# Patient Record
Sex: Male | Born: 1985 | Race: Black or African American | Hispanic: No | Marital: Single | Smoking: Former smoker
Health system: Southern US, Community
[De-identification: ages and names within clinical notes are randomized; demographics above are authoritative.]

## PROBLEM LIST (undated history)

## (undated) DIAGNOSIS — M1990 Unspecified osteoarthritis, unspecified site: Secondary | ICD-10-CM

---
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
## 2006-03-02 ENCOUNTER — Emergency Department (HOSPITAL_COMMUNITY): Admission: EM | Admit: 2006-03-02 | Discharge: 2006-03-02 | Payer: Self-pay | Admitting: Emergency Medicine

## 2006-05-10 ENCOUNTER — Emergency Department (HOSPITAL_COMMUNITY): Admission: EM | Admit: 2006-05-10 | Discharge: 2006-05-10 | Payer: Self-pay | Admitting: Emergency Medicine

## 2006-08-11 ENCOUNTER — Emergency Department (HOSPITAL_COMMUNITY): Admission: EM | Admit: 2006-08-11 | Discharge: 2006-08-11 | Payer: Self-pay | Admitting: Family Medicine

## 2006-12-19 ENCOUNTER — Emergency Department (HOSPITAL_COMMUNITY): Admission: EM | Admit: 2006-12-19 | Discharge: 2006-12-19 | Payer: Self-pay | Admitting: Family Medicine

## 2006-12-28 ENCOUNTER — Emergency Department (HOSPITAL_COMMUNITY): Admission: EM | Admit: 2006-12-28 | Discharge: 2006-12-28 | Payer: Self-pay | Admitting: Emergency Medicine

## 2007-06-07 ENCOUNTER — Emergency Department (HOSPITAL_COMMUNITY): Admission: EM | Admit: 2007-06-07 | Discharge: 2007-06-07 | Payer: Self-pay | Admitting: Emergency Medicine

## 2007-08-19 ENCOUNTER — Emergency Department (HOSPITAL_COMMUNITY): Admission: EM | Admit: 2007-08-19 | Discharge: 2007-08-19 | Payer: Self-pay | Admitting: Family Medicine

## 2008-06-24 ENCOUNTER — Emergency Department (HOSPITAL_COMMUNITY): Admission: EM | Admit: 2008-06-24 | Discharge: 2008-06-24 | Payer: Self-pay | Admitting: Family Medicine

## 2008-08-26 ENCOUNTER — Emergency Department (HOSPITAL_COMMUNITY): Admission: EM | Admit: 2008-08-26 | Discharge: 2008-08-26 | Payer: Self-pay | Admitting: Emergency Medicine

## 2009-03-08 ENCOUNTER — Emergency Department (HOSPITAL_COMMUNITY): Admission: EM | Admit: 2009-03-08 | Discharge: 2009-03-08 | Payer: Self-pay | Admitting: Family Medicine

## 2009-04-21 ENCOUNTER — Emergency Department (HOSPITAL_COMMUNITY): Admission: EM | Admit: 2009-04-21 | Discharge: 2009-04-21 | Payer: Self-pay | Admitting: Family Medicine

## 2009-07-29 ENCOUNTER — Emergency Department (HOSPITAL_COMMUNITY): Admission: EM | Admit: 2009-07-29 | Discharge: 2009-07-29 | Payer: Self-pay | Admitting: Emergency Medicine

## 2010-11-03 ENCOUNTER — Emergency Department (HOSPITAL_BASED_OUTPATIENT_CLINIC_OR_DEPARTMENT_OTHER)
Admission: EM | Admit: 2010-11-03 | Discharge: 2010-11-03 | Disposition: A | Discharge status: Home / Self Care | Payer: Medicaid Other | Attending: Emergency Medicine | Admitting: Emergency Medicine

## 2010-11-03 DIAGNOSIS — R221 Localized swelling, mass and lump, neck: Secondary | ICD-10-CM | POA: Insufficient documentation

## 2010-11-03 DIAGNOSIS — R220 Localized swelling, mass and lump, head: Secondary | ICD-10-CM | POA: Insufficient documentation

## 2010-11-03 DIAGNOSIS — M083 Juvenile rheumatoid polyarthritis (seronegative): Secondary | ICD-10-CM | POA: Insufficient documentation

## 2010-11-03 DIAGNOSIS — L089 Local infection of the skin and subcutaneous tissue, unspecified: Secondary | ICD-10-CM | POA: Insufficient documentation

## 2010-11-03 DIAGNOSIS — R599 Enlarged lymph nodes, unspecified: Secondary | ICD-10-CM | POA: Insufficient documentation

## 2011-06-12 LAB — GC/CHLAMYDIA PROBE AMP, GENITAL
Chlamydia, DNA Probe: NEGATIVE
GC Probe Amp, Genital: NEGATIVE

## 2011-06-12 LAB — HIV ANTIBODY (ROUTINE TESTING W REFLEX): HIV: NONREACTIVE

## 2011-06-12 LAB — RPR: RPR Ser Ql: NONREACTIVE

## 2011-06-16 LAB — POCT URINALYSIS DIP (DEVICE)
Glucose, UA: NEGATIVE mg/dL
Protein, ur: NEGATIVE mg/dL
Urobilinogen, UA: 1 mg/dL (ref 0.0–1.0)

## 2011-06-16 LAB — GC/CHLAMYDIA PROBE AMP, GENITAL
Chlamydia, DNA Probe: NEGATIVE
GC Probe Amp, Genital: NEGATIVE

## 2013-10-03 ENCOUNTER — Encounter (HOSPITAL_COMMUNITY): Payer: Self-pay | Admitting: Emergency Medicine

## 2013-10-03 ENCOUNTER — Emergency Department (HOSPITAL_COMMUNITY)
Admission: EM | Admit: 2013-10-03 | Discharge: 2013-10-03 | Disposition: A | Discharge status: Home / Self Care | Payer: Medicaid Other | Attending: Emergency Medicine | Admitting: Emergency Medicine

## 2013-10-03 DIAGNOSIS — Y9289 Other specified places as the place of occurrence of the external cause: Secondary | ICD-10-CM | POA: Insufficient documentation

## 2013-10-03 DIAGNOSIS — F17200 Nicotine dependence, unspecified, uncomplicated: Secondary | ICD-10-CM | POA: Insufficient documentation

## 2013-10-03 DIAGNOSIS — Y9389 Activity, other specified: Secondary | ICD-10-CM | POA: Insufficient documentation

## 2013-10-03 DIAGNOSIS — S199XXA Unspecified injury of neck, initial encounter: Principal | ICD-10-CM

## 2013-10-03 DIAGNOSIS — M62838 Other muscle spasm: Secondary | ICD-10-CM

## 2013-10-03 DIAGNOSIS — Z8739 Personal history of other diseases of the musculoskeletal system and connective tissue: Secondary | ICD-10-CM | POA: Insufficient documentation

## 2013-10-03 DIAGNOSIS — S0993XA Unspecified injury of face, initial encounter: Secondary | ICD-10-CM | POA: Insufficient documentation

## 2013-10-03 DIAGNOSIS — S0990XA Unspecified injury of head, initial encounter: Secondary | ICD-10-CM | POA: Insufficient documentation

## 2013-10-03 HISTORY — DX: Unspecified osteoarthritis, unspecified site: M19.90

## 2013-10-03 MED ORDER — IBUPROFEN 400 MG PO TABS
800.0000 mg | ORAL_TABLET | Freq: Once | ORAL | Status: AC
  Administered 2013-10-03: 800 mg via ORAL
  Filled 2013-10-03: qty 2

## 2013-10-03 MED ORDER — METHOCARBAMOL 500 MG PO TABS: 500.0000 mg | ORAL_TABLET | Freq: Two times a day (BID) | ORAL | Status: DC | PRN

## 2013-10-03 MED ORDER — IBUPROFEN 800 MG PO TABS: 800.0000 mg | ORAL_TABLET | Freq: Three times a day (TID) | ORAL | Status: DC
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
## 2013-10-03 NOTE — Discharge Instructions (Signed)
Take the prescribed medication as directed.  You will continue to be sore for the next few days-- this is normal. May wish to apply heat to affected areas to help relieve soreness. Return to the ED for new or worsening symptoms.

## 2013-10-03 NOTE — ED Provider Notes (Signed)
Medical screening examination/treatment/procedure(s) were performed by non-physician practitioner and as supervising physician I was immediately available for consultation/collaboration.  EKG Interpretation   None         Lexee Brashears, MD 10/03/13 1537 
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
## 2014-12-31 ENCOUNTER — Encounter (HOSPITAL_COMMUNITY): Payer: Self-pay | Admitting: Emergency Medicine

## 2014-12-31 ENCOUNTER — Emergency Department (HOSPITAL_COMMUNITY)
Admission: EM | Admit: 2014-12-31 | Discharge: 2014-12-31 | Disposition: A | Discharge status: Home / Self Care | Payer: Medicaid Other | Source: Home / Self Care | Attending: Family Medicine | Admitting: Family Medicine

## 2014-12-31 ENCOUNTER — Other Ambulatory Visit (HOSPITAL_COMMUNITY)
Admission: RE | Admit: 2014-12-31 | Discharge: 2014-12-31 | Disposition: A | Discharge status: Home / Self Care | Payer: Medicaid Other | Source: Ambulatory Visit | Attending: Family Medicine | Admitting: Family Medicine

## 2014-12-31 DIAGNOSIS — Z202 Contact with and (suspected) exposure to infections with a predominantly sexual mode of transmission: Secondary | ICD-10-CM

## 2014-12-31 DIAGNOSIS — N342 Other urethritis: Secondary | ICD-10-CM | POA: Diagnosis not present

## 2014-12-31 DIAGNOSIS — Z113 Encounter for screening for infections with a predominantly sexual mode of transmission: Secondary | ICD-10-CM | POA: Insufficient documentation

## 2014-12-31 MED ORDER — CEFTRIAXONE SODIUM 250 MG IJ SOLR
250.0000 mg | Freq: Once | INTRAMUSCULAR | Status: AC
  Administered 2014-12-31: 250 mg via INTRAMUSCULAR

## 2014-12-31 MED ORDER — AZITHROMYCIN 250 MG PO TABS
1000.0000 mg | ORAL_TABLET | Freq: Every day | ORAL | Status: DC
  Administered 2014-12-31: 1000 mg via ORAL

## 2014-12-31 MED ORDER — LIDOCAINE HCL (PF) 1 % IJ SOLN
INTRAMUSCULAR | Status: AC
  Filled 2014-12-31: qty 5

## 2014-12-31 MED ORDER — CEFTRIAXONE SODIUM 250 MG IJ SOLR
INTRAMUSCULAR | Status: AC
  Filled 2014-12-31: qty 250

## 2014-12-31 MED ORDER — AZITHROMYCIN 250 MG PO TABS
ORAL_TABLET | ORAL | Status: AC
  Filled 2014-12-31: qty 4

## 2014-12-31 NOTE — Discharge Instructions (Signed)
Urethritis °Urethritis is an inflammation of the tube through which urine exits your bladder (urethra).  °CAUSES °Urethritis is often caused by an infection in your urethra. The infection can be viral, like herpes. The infection can also be bacterial, like gonorrhea. °RISK FACTORS °Risk factors of urethritis include: °· Having sex without using a condom. °· Having multiple sexual partners. °· Having poor hygiene. °SIGNS AND SYMPTOMS °Symptoms of urethritis are less noticeable in women than in men. These symptoms include: °· Burning feeling when you urinate (dysuria). °· Discharge from your urethra. °· Blood in your urine (hematuria). °· Urinating more than usual. °DIAGNOSIS  °To confirm a diagnosis of urethritis, your health care provider will do the following: °· Ask about your sexual history. °· Perform a physical exam. °· Have you provide a sample of your urine for lab testing. °· Use a cotton swab to gently collect a sample from your urethra for lab testing. °TREATMENT  °It is important to treat urethritis. Depending on the cause, untreated urethritis may lead to serious genital infections and possibly infertility. Urethritis caused by a bacterial infection is treated with antibiotic medicine. All sexual partners must be treated.  °HOME CARE INSTRUCTIONS °· Do not have sex until the test results are known and treatment is completed, even if your symptoms go away before you finish treatment. °· If you were prescribed an antibiotic, finish it all even if you start to feel better. °SEEK MEDICAL CARE IF:  °· Your symptoms are not improved in 3 days. °· Your symptoms are getting worse. °· You develop abdominal pain or pelvic pain (in women). °· You develop joint pain. °· You have a fever. °SEEK IMMEDIATE MEDICAL CARE IF:  °· You have severe pain in the belly, back, or side. °· You have repeated vomiting. °MAKE SURE YOU: °· Understand these instructions. °· Will watch your condition. °· Will get help right away if you  are not doing well or get worse. °Document Released: 02/21/2001 Document Revised: 01/12/2014 Document Reviewed: 04/28/2013 °Exit[employer]® Patient Information ©2015 [employer]. This information is not intended to replace advice given to you by your health care provider. Make sure you discuss any questions you have with your health care provider. ° °

## 2014-12-31 NOTE — ED Provider Notes (Signed)
CSN: [id number]     Arrival date & time 12/31/14  1734 History   First MD Initiated Contact with Patient 12/31/14 1830     Chief Complaint  Patient presents with  . Exposure to STD   (Consider location/radiation/quality/duration/timing/severity/associated sxs/prior Treatment) HPI    29 year old male presents complaining of a possible STD. He has penile discharge that started this morning. He also has a known exposure to STDs. No dysuria or testicle pain.    Past Medical History  Diagnosis Date  . Arthritis    No past surgical history on file. No family history on file. History  Substance Use Topics  . Smoking status: Current Every Day Smoker    Types: Cigarettes  . Smokeless tobacco: Not on file  . Alcohol Use: Yes    Review of Systems  Genitourinary: Positive for discharge. Negative for dysuria and testicular pain.  All other systems reviewed and are negative.   Allergies  Review of patient's allergies indicates no known allergies.  Home Medications   Prior to Admission medications   Medication Sig Start Date End Date Taking? Authorizing Provider  ibuprofen (ADVIL,MOTRIN) 800 MG tablet Take 1 tablet (800 mg total) by mouth 3 (three) times daily. 10/03/13   Garlon HatchetLisa M Sanders, PA-C  methocarbamol (ROBAXIN) 500 MG tablet Take 1 tablet (500 mg total) by mouth 2 (two) times daily as needed. 10/03/13   Garlon HatchetLisa M Sanders, PA-C   BP 136/96 mmHg  Pulse 82  Temp(Src) 98.2 F (36.8 C) (Oral)  SpO2 100% Physical Exam  Constitutional: He is oriented to person, place, and time. He appears well-developed and well-nourished. No distress.  HENT:  Head: Normocephalic.  Pulmonary/Chest: Effort normal. No respiratory distress.  Genitourinary: Testes normal. No penile tenderness. Discharge found.  Lymphadenopathy:       Right: No inguinal adenopathy present.       Left: Inguinal adenopathy present.  Neurological: He is alert and oriented to person, place, and time. Coordination normal.   Skin: Skin is warm and dry. No rash noted. He is not diaphoretic.  Psychiatric: He has a normal mood and affect. Judgment normal.  Nursing note and vitals reviewed.   ED Course  Procedures (including critical care time) Labs Review Labs Reviewed  RPR  HIV ANTIBODY (ROUTINE TESTING)  URINE CYTOLOGY ANCILLARY ONLY    Imaging Review No results found.   MDM   1. Urethritis    Treated with 250 mg ceftriaxone IM and 1 g of azithromycin by mouth. Urine cytology sent as well as HIV and RPR. Follow up PRN   Meds ordered this encounter  Medications  . cefTRIAXone (ROCEPHIN) injection 250 mg    Sig:   . azithromycin (ZITHROMAX) tablet 1,000 mg    Sig:       Graylon GoodZachary H Scot Shiraishi, PA-C 12/31/14 1851

## 2014-12-31 NOTE — ED Notes (Signed)
Pt states that he was told by partner he was exposed to a STD

## 2015-01-01 LAB — URINE CYTOLOGY ANCILLARY ONLY
Chlamydia: POSITIVE — AB
NEISSERIA GONORRHEA: POSITIVE — AB
TRICH (WINDOWPATH): NEGATIVE

## 2015-01-01 LAB — HIV ANTIBODY (ROUTINE TESTING W REFLEX): HIV Screen 4th Generation wRfx: NONREACTIVE

## 2015-01-01 LAB — RPR: RPR: NONREACTIVE

## 2015-01-01 NOTE — ED Notes (Signed)
Lab reports negative 

## 2015-01-04 ENCOUNTER — Telehealth (HOSPITAL_COMMUNITY): Payer: Self-pay | Admitting: *Deleted

## 2015-01-04 NOTE — ED Notes (Addendum)
GC and Chlamydia pos., Trich neg., HIV/RPR non-reactive.  I called pt. Pt. verified x 2 and given results.  Pt. told he was adequately treated with Rocephin and Zithromax.  Pt. instructed to notify his partner, no sex for 1 week and to practice safe sex. Pt. told [facility] by appointment. Patrick Warren, Patrick Warren 01/04/2015 DHHS forms x 2 completed [facility]  Patrick Warren, Patrick Warren  01/06/2015

## 2015-03-22 ENCOUNTER — Emergency Department (HOSPITAL_COMMUNITY)
Admission: EM | Admit: 2015-03-22 | Discharge: 2015-03-22 | Disposition: A | Discharge status: Home / Self Care | Payer: Medicaid Other | Source: Home / Self Care | Attending: Family Medicine | Admitting: Family Medicine

## 2015-03-22 ENCOUNTER — Other Ambulatory Visit (HOSPITAL_COMMUNITY)
Admission: RE | Admit: 2015-03-22 | Discharge: 2015-03-22 | Disposition: A | Discharge status: Home / Self Care | Payer: Medicaid Other | Source: Ambulatory Visit | Attending: Family Medicine | Admitting: Family Medicine

## 2015-03-22 ENCOUNTER — Encounter (HOSPITAL_COMMUNITY): Payer: Self-pay | Admitting: Emergency Medicine

## 2015-03-22 DIAGNOSIS — A5401 Gonococcal cystitis and urethritis, unspecified: Secondary | ICD-10-CM

## 2015-03-22 DIAGNOSIS — A5400 Gonococcal infection of lower genitourinary tract, unspecified: Secondary | ICD-10-CM | POA: Diagnosis not present

## 2015-03-22 DIAGNOSIS — Z113 Encounter for screening for infections with a predominantly sexual mode of transmission: Secondary | ICD-10-CM | POA: Insufficient documentation

## 2015-03-22 MED ORDER — CEFTRIAXONE SODIUM 250 MG IJ SOLR
INTRAMUSCULAR | Status: AC
  Filled 2015-03-22: qty 250

## 2015-03-22 MED ORDER — AZITHROMYCIN 250 MG PO TABS
1000.0000 mg | ORAL_TABLET | Freq: Once | ORAL | Status: AC
  Administered 2015-03-22: 1000 mg via ORAL

## 2015-03-22 MED ORDER — LIDOCAINE HCL (PF) 1 % IJ SOLN
INTRAMUSCULAR | Status: AC
  Filled 2015-03-22: qty 5

## 2015-03-22 MED ORDER — AZITHROMYCIN 250 MG PO TABS
ORAL_TABLET | ORAL | Status: AC
  Filled 2015-03-22: qty 4

## 2015-03-22 MED ORDER — CEFTRIAXONE SODIUM 250 MG IJ SOLR
250.0000 mg | Freq: Once | INTRAMUSCULAR | Status: AC
  Administered 2015-03-22: 250 mg via INTRAMUSCULAR

## 2015-03-22 NOTE — Discharge Instructions (Signed)
We will call with positive test results and treat as indicated  °

## 2015-03-22 NOTE — ED Notes (Signed)
Pt states he is having d/c, but denies any pain or other issues.

## 2015-03-22 NOTE — ED Provider Notes (Signed)
CSN: [id number]     Arrival date & time 03/22/15  1607 History   First MD Initiated Contact with Patient 03/22/15 1737     Chief Complaint  Patient presents with  . SEXUALLY TRANSMITTED DISEASE   (Consider location/radiation/quality/duration/timing/severity/associated sxs/prior Treatment) Patient is a 29 y.o. male presenting with STD exposure. The history is provided by the patient.  Exposure to STD This is a new problem. The current episode started yesterday (green urethral d/c). The problem has been gradually worsening.    Past Medical History  Diagnosis Date  . Arthritis    History reviewed. No pertinent past surgical history. History reviewed. No pertinent family history. History  Substance Use Topics  . Smoking status: Current Every Day Smoker    Types: Cigarettes  . Smokeless tobacco: Not on file  . Alcohol Use: Yes    Review of Systems  Gastrointestinal: Negative.   Genitourinary: Positive for discharge. Negative for dysuria, urgency, decreased urine volume and genital sores.  Musculoskeletal: Negative.     Allergies  Review of patient's allergies indicates no known allergies.  Home Medications   Prior to Admission medications   Medication Sig Start Date End Date Taking? Authorizing Provider  ibuprofen (ADVIL,MOTRIN) 800 MG tablet Take 1 tablet (800 mg total) by mouth 3 (three) times daily. 10/03/13   Garlon HatchetLisa M Sanders, PA-C  methocarbamol (ROBAXIN) 500 MG tablet Take 1 tablet (500 mg total) by mouth 2 (two) times daily as needed. 10/03/13   Garlon HatchetLisa M Sanders, PA-C   BP 124/82 mmHg  Pulse 68  Temp(Src) 97.5 F (36.4 C) (Oral)  Resp 18  SpO2 100% Physical Exam  Constitutional: He is oriented to person, place, and time. He appears well-developed and well-nourished. No distress.  Genitourinary: No penile tenderness.  Purulent urethral d/c.  Neurological: He is alert and oriented to person, place, and time.  Skin: Skin is warm and dry.  Nursing note and vitals  reviewed.   ED Course  Procedures (including critical care time) Labs Review Labs Reviewed  CYTOLOGY, (ORAL, ANAL, URETHRAL) ANCILLARY ONLY    Imaging Review No results found.   MDM   1. Urethritis, gonococcal, acute        Linna HoffJames D Myrta Mercer, MD 03/22/15 2021

## 2015-03-23 LAB — CYTOLOGY, (ORAL, ANAL, URETHRAL) ANCILLARY ONLY
CHLAMYDIA, DNA PROBE: NEGATIVE
Neisseria Gonorrhea: POSITIVE — AB
Trichomonas: NEGATIVE

## 2015-03-23 NOTE — ED Notes (Signed)
Final report of STD screening positive for GC. Treatment adequate w medications given to patient day of [facility] visit. [id number]. Attempted to reach patient at number provided on day of visit, but male who answered stated there was no one by that name at that number. Letter sent to address provided on day of visit for him to contact us for his results

## 2015-03-26 NOTE — ED Notes (Signed)
Call from patient to inquire about his test results. After verifying ID, discussed lab results. Patient has been advised he has received adequate treatment, and he is to practice safer sex, and he is to inform his partner , so they can also be treated . He is also to avoid sex x 7 more days to assure proper remission of infection

## 2015-12-03 ENCOUNTER — Emergency Department (HOSPITAL_COMMUNITY)
Admission: EM | Admit: 2015-12-03 | Discharge: 2015-12-03 | Disposition: A | Discharge status: Home / Self Care | Payer: Medicaid Other | Attending: Physician Assistant | Admitting: Physician Assistant

## 2015-12-03 ENCOUNTER — Encounter (HOSPITAL_COMMUNITY): Payer: Self-pay | Admitting: Emergency Medicine

## 2015-12-03 DIAGNOSIS — S51811A Laceration without foreign body of right forearm, initial encounter: Secondary | ICD-10-CM | POA: Insufficient documentation

## 2015-12-03 DIAGNOSIS — F17210 Nicotine dependence, cigarettes, uncomplicated: Secondary | ICD-10-CM | POA: Insufficient documentation

## 2015-12-03 DIAGNOSIS — M1990 Unspecified osteoarthritis, unspecified site: Secondary | ICD-10-CM | POA: Insufficient documentation

## 2015-12-03 DIAGNOSIS — Z791 Long term (current) use of non-steroidal anti-inflammatories (NSAID): Secondary | ICD-10-CM | POA: Diagnosis not present

## 2015-12-03 DIAGNOSIS — S46221A Laceration of muscle, fascia and tendon of other parts of biceps, right arm, initial encounter: Secondary | ICD-10-CM | POA: Insufficient documentation

## 2015-12-03 DIAGNOSIS — Y998 Other external cause status: Secondary | ICD-10-CM | POA: Insufficient documentation

## 2015-12-03 DIAGNOSIS — Y9289 Other specified places as the place of occurrence of the external cause: Secondary | ICD-10-CM | POA: Diagnosis not present

## 2015-12-03 DIAGNOSIS — Y9389 Activity, other specified: Secondary | ICD-10-CM | POA: Insufficient documentation

## 2015-12-03 DIAGNOSIS — S61011A Laceration without foreign body of right thumb without damage to nail, initial encounter: Secondary | ICD-10-CM | POA: Diagnosis not present

## 2015-12-03 DIAGNOSIS — Z23 Encounter for immunization: Secondary | ICD-10-CM | POA: Insufficient documentation

## 2015-12-03 DIAGNOSIS — IMO0002 Reserved for concepts with insufficient information to code with codable children: Secondary | ICD-10-CM

## 2015-12-03 DIAGNOSIS — W25XXXA Contact with sharp glass, initial encounter: Secondary | ICD-10-CM | POA: Insufficient documentation

## 2015-12-03 MED ORDER — CEPHALEXIN 500 MG PO CAPS: 500.0000 mg | ORAL_CAPSULE | Freq: Three times a day (TID) | ORAL | Status: DC

## 2015-12-03 MED ORDER — LIDOCAINE-EPINEPHRINE (PF) 2 %-1:200000 IJ SOLN
40.0000 mL | Freq: Once | INTRAMUSCULAR | Status: AC
  Administered 2015-12-03: 40 mL
  Filled 2015-12-03: qty 40

## 2015-12-03 MED ORDER — TETANUS-DIPHTH-ACELL PERTUSSIS 5-2.5-18.5 LF-MCG/0.5 IM SUSP
0.5000 mL | Freq: Once | INTRAMUSCULAR | Status: AC
  Administered 2015-12-03: 0.5 mL via INTRAMUSCULAR
  Filled 2015-12-03: qty 0.5

## 2015-12-03 MED ORDER — OXYCODONE-ACETAMINOPHEN 5-325 MG PO TABS
1.0000 | ORAL_TABLET | Freq: Once | ORAL | Status: AC
  Administered 2015-12-03: 1 via ORAL
  Filled 2015-12-03: qty 1
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
## 2015-12-03 NOTE — Discharge Instructions (Signed)
Keep her wound clean and dry. Take all of your antibiotics as prescribed. Do not save or share them. Return to ED in 12 days for removal of your sutures. Return sooner fevers, chills, increased redness or swelling, cloudy drainage or other symptoms as we discussed.

## 2015-12-03 NOTE — ED Notes (Signed)
Ben PA at bedside. 
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
## 2015-12-15 ENCOUNTER — Ambulatory Visit (HOSPITAL_COMMUNITY)
Admission: EM | Admit: 2015-12-15 | Discharge: 2015-12-15 | Disposition: A | Discharge status: Home / Self Care | Payer: Medicaid Other | Attending: Family Medicine | Admitting: Family Medicine

## 2015-12-15 ENCOUNTER — Encounter (HOSPITAL_COMMUNITY): Payer: Self-pay | Admitting: Emergency Medicine

## 2015-12-15 DIAGNOSIS — Z4802 Encounter for removal of sutures: Secondary | ICD-10-CM

## 2015-12-15 NOTE — Discharge Instructions (Signed)
Suture Removal, Care After Lungs clear and clean with warm soap and water. For any signs of infection return to the urgent care. Start taking your antibiotics promptly. Do not get the wound dirty. Refer to this sheet in the next few weeks. These instructions provide you with information on caring for yourself after your procedure. Your health care provider may also give you more specific instructions. Your treatment has been planned according to current medical practices, but problems sometimes occur. Call your health care provider if you have any problems or questions after your procedure. WHAT TO EXPECT AFTER THE PROCEDURE After your stitches (sutures) are removed, it is typical to have the following:  Some discomfort and swelling in the wound area.  Slight redness in the area. HOME CARE INSTRUCTIONS   If you have skin adhesive strips over the wound area, do not take the strips off. They will fall off on their own in a few days. If the strips remain in place after 14 days, you may remove them.  Change any bandages (dressings) at least once a day or as directed by your health care provider. If the bandage sticks, soak it off with warm, soapy water.  Apply cream or ointment only as directed by your health care provider. If using cream or ointment, wash the area with soap and water 2 times a day to remove all the cream or ointment. Rinse off the soap and pat the area dry with a clean towel.  Keep the wound area dry and clean. If the bandage becomes wet or dirty, or if it develops a bad smell, change it as soon as possible.  Continue to protect the wound from injury.  Use sunscreen when out in the sun. New scars become sunburned easily. SEEK MEDICAL CARE IF:  You have increasing redness, swelling, or pain in the wound.  You see pus coming from the wound.  You have a fever.  You notice a bad smell coming from the wound or dressing.  Your wound breaks open (edges not staying together).   This information is not intended to replace advice given to you by your health care provider. Make sure you discuss any questions you have with your health care provider.   Document Released: 05/23/2001 Document Revised: 06/18/2013 Document Reviewed: 04/09/2013 Elsevier Interactive Patient Education [employer]2016 Elsevier Inc.
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
## 2015-12-15 NOTE — ED Notes (Signed)
Multiple sutured sites.  Patient reports sutures have been in for 12 days.   Also has an avulsion injury to left thumb that occurred this evening.  Reports injury caused by a piece of glass.

## 2016-01-03 ENCOUNTER — Emergency Department (HOSPITAL_COMMUNITY)
Admission: EM | Admit: 2016-01-03 | Discharge: 2016-01-03 | Disposition: A | Discharge status: Home / Self Care | Payer: Medicaid Other | Attending: Emergency Medicine | Admitting: Emergency Medicine

## 2016-01-03 ENCOUNTER — Encounter (HOSPITAL_COMMUNITY): Payer: Self-pay | Admitting: *Deleted

## 2016-01-03 ENCOUNTER — Emergency Department (HOSPITAL_COMMUNITY): Payer: Medicaid Other

## 2016-01-03 DIAGNOSIS — S3992XA Unspecified injury of lower back, initial encounter: Secondary | ICD-10-CM | POA: Insufficient documentation

## 2016-01-03 DIAGNOSIS — Y9389 Activity, other specified: Secondary | ICD-10-CM | POA: Insufficient documentation

## 2016-01-03 DIAGNOSIS — Z791 Long term (current) use of non-steroidal anti-inflammatories (NSAID): Secondary | ICD-10-CM | POA: Diagnosis not present

## 2016-01-03 DIAGNOSIS — Y998 Other external cause status: Secondary | ICD-10-CM | POA: Diagnosis not present

## 2016-01-03 DIAGNOSIS — M1990 Unspecified osteoarthritis, unspecified site: Secondary | ICD-10-CM | POA: Insufficient documentation

## 2016-01-03 DIAGNOSIS — F17210 Nicotine dependence, cigarettes, uncomplicated: Secondary | ICD-10-CM | POA: Insufficient documentation

## 2016-01-03 DIAGNOSIS — Y92410 Unspecified street and highway as the place of occurrence of the external cause: Secondary | ICD-10-CM | POA: Diagnosis not present

## 2016-01-03 DIAGNOSIS — Z792 Long term (current) use of antibiotics: Secondary | ICD-10-CM | POA: Diagnosis not present

## 2016-01-03 DIAGNOSIS — M545 Low back pain, unspecified: Secondary | ICD-10-CM

## 2016-01-03 DIAGNOSIS — S29002A Unspecified injury of muscle and tendon of back wall of thorax, initial encounter: Secondary | ICD-10-CM | POA: Insufficient documentation

## 2016-01-03 MED ORDER — KETOROLAC TROMETHAMINE 30 MG/ML IJ SOLN
30.0000 mg | Freq: Once | INTRAMUSCULAR | Status: AC
  Administered 2016-01-03: 30 mg via INTRAMUSCULAR
  Filled 2016-01-03: qty 1

## 2016-01-03 MED ORDER — CYCLOBENZAPRINE HCL 10 MG PO TABS: 10.0000 mg | ORAL_TABLET | Freq: Two times a day (BID) | ORAL | Status: DC | PRN

## 2016-01-03 MED ORDER — NAPROXEN 500 MG PO TABS: 500.0000 mg | ORAL_TABLET | Freq: Two times a day (BID) | ORAL | Status: DC
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
## 2016-01-31 ENCOUNTER — Encounter (HOSPITAL_COMMUNITY): Payer: Self-pay | Admitting: Emergency Medicine

## 2016-01-31 ENCOUNTER — Emergency Department (HOSPITAL_COMMUNITY)
Admission: EM | Admit: 2016-01-31 | Discharge: 2016-01-31 | Disposition: A | Discharge status: Home / Self Care | Payer: Medicaid Other | Attending: Dermatology | Admitting: Dermatology

## 2016-01-31 DIAGNOSIS — R51 Headache: Secondary | ICD-10-CM | POA: Diagnosis present

## 2016-01-31 DIAGNOSIS — F17210 Nicotine dependence, cigarettes, uncomplicated: Secondary | ICD-10-CM | POA: Insufficient documentation

## 2016-01-31 DIAGNOSIS — Y999 Unspecified external cause status: Secondary | ICD-10-CM | POA: Insufficient documentation

## 2016-01-31 DIAGNOSIS — M1990 Unspecified osteoarthritis, unspecified site: Secondary | ICD-10-CM | POA: Insufficient documentation

## 2016-01-31 DIAGNOSIS — Y92410 Unspecified street and highway as the place of occurrence of the external cause: Secondary | ICD-10-CM | POA: Insufficient documentation

## 2016-01-31 DIAGNOSIS — Z5321 Procedure and treatment not carried out due to patient leaving prior to being seen by health care provider: Secondary | ICD-10-CM | POA: Insufficient documentation

## 2016-01-31 DIAGNOSIS — Y939 Activity, unspecified: Secondary | ICD-10-CM | POA: Diagnosis not present

## 2016-01-31 NOTE — ED Notes (Signed)
Attempted to call patient's name in lobby to obtain vitals. No response
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
## 2018-01-26 ENCOUNTER — Other Ambulatory Visit: Payer: Self-pay

## 2018-01-26 ENCOUNTER — Encounter (HOSPITAL_COMMUNITY): Payer: Self-pay | Admitting: Emergency Medicine

## 2018-01-26 ENCOUNTER — Ambulatory Visit (HOSPITAL_COMMUNITY)
Admission: EM | Admit: 2018-01-26 | Discharge: 2018-01-26 | Disposition: A | Discharge status: Home / Self Care | Payer: Medicaid Other | Attending: Internal Medicine | Admitting: Internal Medicine

## 2018-01-26 DIAGNOSIS — Z113 Encounter for screening for infections with a predominantly sexual mode of transmission: Secondary | ICD-10-CM | POA: Diagnosis not present

## 2018-01-26 DIAGNOSIS — Z202 Contact with and (suspected) exposure to infections with a predominantly sexual mode of transmission: Secondary | ICD-10-CM | POA: Diagnosis not present

## 2018-01-26 DIAGNOSIS — F17210 Nicotine dependence, cigarettes, uncomplicated: Secondary | ICD-10-CM | POA: Diagnosis not present

## 2018-01-26 DIAGNOSIS — Z79899 Other long term (current) drug therapy: Secondary | ICD-10-CM | POA: Insufficient documentation

## 2018-01-26 MED ORDER — METRONIDAZOLE 500 MG PO TABS: 2000.0000 mg | ORAL_TABLET | Freq: Once | ORAL | 0 refills | Status: AC
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
## 2018-01-26 NOTE — Discharge Instructions (Signed)
Please take all 4 pills of metronidazole tomorrow when you have food on your stomach.  Do not drink alcohol while taking  We will call with results and send in any further needed medicaiton.

## 2018-01-28 ENCOUNTER — Telehealth (HOSPITAL_COMMUNITY): Payer: Self-pay

## 2018-01-28 LAB — URINE CYTOLOGY ANCILLARY ONLY
Chlamydia: NEGATIVE
NEISSERIA GONORRHEA: NEGATIVE
TRICH (WINDOWPATH): NEGATIVE

## 2018-01-28 NOTE — Telephone Encounter (Signed)
Std screening negative, pt called and made aware. Answered all questions.

## 2018-02-08 ENCOUNTER — Emergency Department (HOSPITAL_COMMUNITY)
Admission: EM | Admit: 2018-02-08 | Discharge: 2018-02-08 | Disposition: A | Discharge status: Home / Self Care | Payer: Medicaid Other | Attending: Emergency Medicine | Admitting: Emergency Medicine

## 2018-02-08 ENCOUNTER — Encounter (HOSPITAL_COMMUNITY): Payer: Self-pay

## 2018-02-08 DIAGNOSIS — F17210 Nicotine dependence, cigarettes, uncomplicated: Secondary | ICD-10-CM | POA: Insufficient documentation

## 2018-02-08 DIAGNOSIS — F1410 Cocaine abuse, uncomplicated: Secondary | ICD-10-CM | POA: Diagnosis not present

## 2018-02-08 DIAGNOSIS — R55 Syncope and collapse: Secondary | ICD-10-CM | POA: Diagnosis not present

## 2018-02-08 LAB — ETHANOL: ALCOHOL ETHYL (B): 31 mg/dL — AB (ref <10)

## 2018-02-08 LAB — RAPID URINE DRUG SCREEN, HOSP PERFORMED
AMPHETAMINES: NOT DETECTED
BENZODIAZEPINES: NOT DETECTED
Barbiturates: NOT DETECTED
Cocaine: POSITIVE — AB
Opiates: NOT DETECTED
Tetrahydrocannabinol: NOT DETECTED

## 2018-02-08 MED ORDER — AMMONIA AROMATIC IN INHA
RESPIRATORY_TRACT | Status: AC
  Filled 2018-02-08: qty 10

## 2018-02-08 MED ORDER — SODIUM CHLORIDE 0.9 % IV BOLUS
1000.0000 mL | Freq: Once | INTRAVENOUS | Status: AC
  Administered 2018-02-08: 1000 mL via INTRAVENOUS

## 2018-02-08 NOTE — ED Provider Notes (Signed)
Care assumed from Dr. Read Drivers at 8:10 AM.  Patient somnolent.  However, after sleeping I reevaluated him.  He awakens to an ammonia capsule.  He is awake.  Amatory.  And texting on his phone.  He is appropriate for discharge.  He is stable vital signs without tachycardia, hypotension, fever, or hypoxemia.   Rolland Porter, MD 02/08/18 [phone]

## 2018-02-08 NOTE — Discharge Instructions (Addendum)
Just say no to drugs!!!!!!! °
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
## 2018-02-08 NOTE — ED Triage Notes (Signed)
Pt is in police custody for possession of drugs and when they got to the police station is was starting to pass out Pt was talking to EMS saying he was hot and thirsty Pt's dad's funeral is today

## 2018-06-05 ENCOUNTER — Encounter (HOSPITAL_COMMUNITY): Payer: Self-pay | Admitting: Emergency Medicine

## 2018-06-05 ENCOUNTER — Emergency Department (HOSPITAL_COMMUNITY)
Admission: EM | Admit: 2018-06-05 | Discharge: 2018-06-05 | Disposition: A | Discharge status: Home / Self Care | Payer: Medicaid Other | Attending: Emergency Medicine | Admitting: Emergency Medicine

## 2018-06-05 ENCOUNTER — Emergency Department (HOSPITAL_COMMUNITY): Payer: Medicaid Other

## 2018-06-05 ENCOUNTER — Other Ambulatory Visit: Payer: Self-pay

## 2018-06-05 DIAGNOSIS — Y929 Unspecified place or not applicable: Secondary | ICD-10-CM | POA: Diagnosis not present

## 2018-06-05 DIAGNOSIS — S0181XA Laceration without foreign body of other part of head, initial encounter: Secondary | ICD-10-CM | POA: Insufficient documentation

## 2018-06-05 DIAGNOSIS — Y999 Unspecified external cause status: Secondary | ICD-10-CM | POA: Insufficient documentation

## 2018-06-05 DIAGNOSIS — Y939 Activity, unspecified: Secondary | ICD-10-CM | POA: Insufficient documentation

## 2018-06-05 DIAGNOSIS — S098XXA Other specified injuries of head, initial encounter: Secondary | ICD-10-CM | POA: Diagnosis present

## 2018-06-05 DIAGNOSIS — Z79899 Other long term (current) drug therapy: Secondary | ICD-10-CM | POA: Diagnosis not present

## 2018-06-05 DIAGNOSIS — F17210 Nicotine dependence, cigarettes, uncomplicated: Secondary | ICD-10-CM | POA: Insufficient documentation

## 2018-06-05 MED ORDER — CEPHALEXIN 500 MG PO CAPS: 500.0000 mg | ORAL_CAPSULE | Freq: Three times a day (TID) | ORAL | 0 refills | Status: AC

## 2018-06-05 MED ORDER — LIDOCAINE-EPINEPHRINE (PF) 2 %-1:200000 IJ SOLN
10.0000 mL | Freq: Once | INTRAMUSCULAR | Status: AC
  Administered 2018-06-05: 10 mL
  Filled 2018-06-05: qty 20
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
## 2018-06-05 NOTE — ED Notes (Signed)
ED Provider at bedside. 

## 2018-06-05 NOTE — Discharge Instructions (Addendum)
Wound check in 2 days with urgent care or PCP referral above. Suture removal in 5 days with urgent care or PCP. Return to ER for any concerning symptoms.
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
## 2018-06-10 ENCOUNTER — Ambulatory Visit (HOSPITAL_COMMUNITY)
Admission: EM | Admit: 2018-06-10 | Discharge: 2018-06-10 | Disposition: A | Discharge status: Home / Self Care | Payer: Medicaid Other

## 2018-06-10 DIAGNOSIS — Z4802 Encounter for removal of sutures: Secondary | ICD-10-CM | POA: Diagnosis not present

## 2018-06-10 DIAGNOSIS — S0181XA Laceration without foreign body of other part of head, initial encounter: Secondary | ICD-10-CM

## 2018-06-10 NOTE — ED Notes (Signed)
Bed: UC01 Expected date:  Expected time:  Means of arrival:  Comments: 

## 2018-06-10 NOTE — ED Notes (Signed)
Pt here for suture removal. 7 sutures removed from forehead. Wound well healed. Mild bleeding. Bandage applied. Pt had no further needs.

## 2018-12-25 ENCOUNTER — Encounter (HOSPITAL_COMMUNITY): Payer: Self-pay | Admitting: *Deleted

## 2018-12-25 ENCOUNTER — Other Ambulatory Visit: Payer: Self-pay

## 2018-12-25 ENCOUNTER — Emergency Department (HOSPITAL_COMMUNITY)
Admission: EM | Admit: 2018-12-25 | Discharge: 2018-12-25 | Disposition: A | Discharge status: Home / Self Care | Payer: Medicaid Other | Attending: Emergency Medicine | Admitting: Emergency Medicine

## 2018-12-25 DIAGNOSIS — F17210 Nicotine dependence, cigarettes, uncomplicated: Secondary | ICD-10-CM | POA: Insufficient documentation

## 2018-12-25 DIAGNOSIS — Z79899 Other long term (current) drug therapy: Secondary | ICD-10-CM | POA: Insufficient documentation

## 2018-12-25 DIAGNOSIS — J020 Streptococcal pharyngitis: Secondary | ICD-10-CM | POA: Diagnosis not present

## 2018-12-25 DIAGNOSIS — J029 Acute pharyngitis, unspecified: Secondary | ICD-10-CM | POA: Diagnosis present

## 2018-12-25 LAB — GROUP A STREP BY PCR: Group A Strep by PCR: DETECTED — AB

## 2018-12-25 MED ORDER — IBUPROFEN 200 MG PO TABS
600.0000 mg | ORAL_TABLET | Freq: Once | ORAL | Status: AC
  Administered 2018-12-25: 600 mg via ORAL
  Filled 2018-12-25: qty 3

## 2018-12-25 MED ORDER — PENICILLIN G BENZATHINE 1200000 UNIT/2ML IM SUSP
1.2000 10*6.[IU] | Freq: Once | INTRAMUSCULAR | Status: AC
  Administered 2018-12-25: 1.2 10*6.[IU] via INTRAMUSCULAR
  Filled 2018-12-25: qty 2

## 2018-12-25 MED ORDER — DEXAMETHASONE 4 MG PO TABS
8.0000 mg | ORAL_TABLET | Freq: Once | ORAL | Status: AC
  Administered 2018-12-25: 03:00:00 8 mg via ORAL
  Filled 2018-12-25: qty 2

## 2018-12-25 NOTE — ED Provider Notes (Signed)
[facility]-EMERGENCY DEPT Provider Note   CSN: [id number] Arrival date & time: 12/25/18  0155    History   Chief Complaint Chief Complaint  Patient presents with  . Sore Throat    HPI Patrick Warren is a 33 y.o. male presenting to the emergency department with gradual onset of sore throat x3 days.  Patient states his throat began feeling sore when he was playing cards.  He states he was yelling and thought this may have been the cause of his sore throat.  No aggravating factors.  He developed a mild dry cough.  Has been treating his symptoms with Alka-Seltzer cold and flu.  Denies rhinorrhea, nasal congestion, ear pain, fever.  No known sick contacts.     The history is provided by the patient.    Past Medical History:  Diagnosis Date  . Arthritis     There are no active problems to display for this patient.   History reviewed. No pertinent surgical history.      Home Medications    Prior to Admission medications   Medication Sig Start Date End Date Taking? Authorizing Provider  cyclobenzaprine (FLEXERIL) 10 MG tablet Take 1 tablet (10 mg total) by mouth 2 (two) times daily as needed for muscle spasms. 01/03/16   Cartner, Sharlet SalinaBenjamin, PA-C  ibuprofen (ADVIL,MOTRIN) 800 MG tablet Take 1 tablet (800 mg total) by mouth 3 (three) times daily. 10/03/13   Garlon HatchetSanders, Lisa M, PA-C  methocarbamol (ROBAXIN) 500 MG tablet Take 1 tablet (500 mg total) by mouth 2 (two) times daily as needed. 10/03/13   Garlon HatchetSanders, Lisa M, PA-C  naproxen (NAPROSYN) 500 MG tablet Take 1 tablet (500 mg total) by mouth 2 (two) times daily. 01/03/16   Joycie Peekartner, Benjamin, PA-C    Family History Family History  Problem Relation Age of Onset  . Cancer Father     Social History Social History   Tobacco Use  . Smoking status: Current Every Day Smoker    Types: Cigarettes  . Smokeless tobacco: Never Used  Substance Use Topics  . Alcohol use: Yes  . Drug use: Yes    Types: Marijuana      Allergies   Patient has no known allergies.   Review of Systems Review of Systems  Constitutional: Negative for fever.  HENT: Positive for sore throat. Negative for congestion, ear pain, rhinorrhea and trouble swallowing.   Respiratory: Positive for cough. Negative for shortness of breath.      Physical Exam Updated Vital Signs BP (!) 137/96 (BP Location: Left Arm)   Pulse 71   Temp 98.6 F (37 C) (Oral)   Resp 16   Ht 5\' 4"  (1.626 m)   Wt 72.6 kg   SpO2 99%   BMI 27.46 kg/m   Physical Exam Vitals signs and nursing note reviewed.  Constitutional:      General: He is not in acute distress.    Appearance: He is well-developed.  HENT:     Head: Normocephalic and atraumatic.     Right Ear: There is impacted cerumen.     Left Ear:  No middle ear effusion. Tympanic membrane is erythematous.     Mouth/Throat:     Pharynx: Uvula midline. Posterior oropharyngeal erythema present. No pharyngeal swelling, oropharyngeal exudate or uvula swelling.     Tonsils: No tonsillar exudate or tonsillar abscesses.     Comments: Tolerating secretions.  No trismus. Eyes:     Conjunctiva/sclera: Conjunctivae normal.  Neck:  Musculoskeletal: Normal range of motion and neck supple. No neck rigidity.  Cardiovascular:     Rate and Rhythm: Normal rate and regular rhythm.  Pulmonary:     Effort: Pulmonary effort is normal. No respiratory distress.     Breath sounds: Normal breath sounds.  Lymphadenopathy:     Cervical: Cervical adenopathy (Mild bilateral anterior cervical) present.  Neurological:     Mental Status: He is alert.  Psychiatric:        Mood and Affect: Mood normal.        Behavior: Behavior normal.      ED Treatments / Results  Labs (all labs ordered are listed, but only abnormal results are displayed) Labs Reviewed  GROUP A STREP BY PCR - Abnormal; Notable for the following components:      Result Value   Group A Strep by PCR DETECTED (*)    All other  components within normal limits    EKG None  Radiology No results found.  Procedures Procedures (including critical care time)  Medications Ordered in ED Medications  penicillin g benzathine (BICILLIN LA) 1200000 UNIT/2ML injection 1.2 Million Units (has no administration in time range)  ibuprofen (ADVIL,MOTRIN) tablet 600 mg (600 mg Oral Given 12/25/18 0239)  dexamethasone (DECADRON) tablet 8 mg (8 mg Oral Given 12/25/18 0239)     Initial Impression / Assessment and Plan / ED Course  I have reviewed the triage vital signs and the nursing notes.  Pertinent labs & imaging results that were available during my care of the patient were reviewed by me and considered in my medical decision making (see chart for details).        Pt presenting with sore throat that began 3 days ago. On exam, pt is tolerating secretions, uvula midline without trismus, tonsillar edema and erythema without exudates, anterior cervical adenopathy present.  Afebrile. Positive strep by PCR. Treated in the Ed with steroids, NSAIDs, and PCN IM.  Discussed importance of water rehydration. Presentation not concerning for PTA or infxn spread to soft tissue. Specific return precautions discussed. Pt able to drink water in ED without difficulty with intact air way. Recommended PCP follow up.   Discussed results, findings, treatment and follow up. Patient advised of return precautions. Patient verbalized understanding and agreed with plan.  Final Clinical Impressions(s) / ED Diagnoses   Final diagnoses:  Strep throat    ED Discharge Orders    None       , [place] N, PA-C 12/25/18 0326    Dione Booze, MD 12/25/18 0700

## 2018-12-25 NOTE — Discharge Instructions (Addendum)
Please read instructions below.  You have been treated with penicillin today for strep throat. You can take tylenol or ibuprofen  as needed for sore throat or fever.  Drink plenty of water.  Use saline nasal spray for congestion. Follow up with your primary care provider.  Return to the ER for inability to swallow liquids, difficulty breathing, or new or worsening symptoms.  
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
## 2018-12-25 NOTE — ED Notes (Signed)
Pt ambulating to rest room.

## 2018-12-27 ENCOUNTER — Ambulatory Visit (HOSPITAL_COMMUNITY)
Admission: EM | Admit: 2018-12-27 | Discharge: 2018-12-27 | Disposition: A | Discharge status: Home / Self Care | Payer: Medicaid Other | Attending: Family Medicine | Admitting: Family Medicine

## 2018-12-27 ENCOUNTER — Encounter (HOSPITAL_COMMUNITY): Payer: Self-pay

## 2018-12-27 DIAGNOSIS — R066 Hiccough: Secondary | ICD-10-CM

## 2018-12-27 MED ORDER — PANTOPRAZOLE SODIUM 40 MG PO TBEC: 40.0000 mg | DELAYED_RELEASE_TABLET | Freq: Two times a day (BID) | ORAL | 0 refills | Status: DC

## 2018-12-27 NOTE — ED Notes (Signed)
Patient verbalizes understanding of discharge instructions. Opportunity for questioning and answers were provided. Patient discharged from [facility] by provider.  

## 2018-12-27 NOTE — ED Triage Notes (Signed)
Pt c/o hiccups x 3 days

## 2018-12-27 NOTE — ED Provider Notes (Signed)
[facility]    CSN: [id number] Arrival date & time: 12/27/18  1445     History   Chief Complaint Chief Complaint  Patient presents with  . Hiccups    HPI Patrick Warren is a 33 y.o. male.   HPI  Recently treated for strep A infection two days prior and noticed hiccups started after taking antibiotics and eating pizza. No history of intractable hiccups or GERD. He is current smoker. He has attempted relief with drinking large volumes of water and  soda without success.  Past Medical History:  Diagnosis Date  . Arthritis       Home Medications    Prior to Admission medications   Medication Sig Start Date End Date Taking? Authorizing Provider  cyclobenzaprine (FLEXERIL) 10 MG tablet Take 1 tablet (10 mg total) by mouth 2 (two) times daily as needed for muscle spasms. 01/03/16   Cartner, Sharlet SalinaBenjamin, PA-C  ibuprofen (ADVIL,MOTRIN) 800 MG tablet Take 1 tablet (800 mg total) by mouth 3 (three) times daily. 10/03/13   Garlon HatchetSanders, Lisa M, PA-C  methocarbamol (ROBAXIN) 500 MG tablet Take 1 tablet (500 mg total) by mouth 2 (two) times daily as needed. 10/03/13   Garlon HatchetSanders, Lisa M, PA-C  naproxen (NAPROSYN) 500 MG tablet Take 1 tablet (500 mg total) by mouth 2 (two) times daily. 01/03/16   Cartner, Sharlet SalinaBenjamin, PA-C  pantoprazole (PROTONIX) 40 MG tablet Take 1 tablet (40 mg total) by mouth 2 (two) times daily for 10 days. 12/27/18 01/06/19  Bing NeighborsHarris, Hanif Radin S, FNP    Family History Family History  Problem Relation Age of Onset  . Cancer Father     Social History Social History   Tobacco Use  . Smoking status: Current Every Day Smoker    Types: Cigarettes  . Smokeless tobacco: Never Used  Substance Use Topics  . Alcohol use: Yes  . Drug use: Yes    Types: Marijuana     Allergies   Patient has no known allergies.   Review of Systems Review of Systems Pertinent negatives listed in HPI Physical Exam Triage Vital Signs ED Triage Vitals  Enc Vitals Group     BP  12/27/18 1524 128/86     Pulse Rate 12/27/18 1524 74     Resp 12/27/18 1524 18     Temp 12/27/18 1524 98 F (36.7 C)     Temp Source 12/27/18 1524 Oral     SpO2 12/27/18 1524 100 %     Weight --      Height --      Head Circumference --      Peak Flow --      Pain Score 12/27/18 1525 5     Pain Loc --      Pain Edu? --      Excl. in GC? --    No data found.  Updated Vital Signs BP 128/86 (BP Location: Right Arm)   Pulse 74   Temp 98 F (36.7 C) (Oral)   Resp 18   SpO2 100%   Visual Acuity Right Eye Distance:   Left Eye Distance:   Bilateral Distance:    Right Eye Near:   Left Eye Near:    Bilateral Near:     Physical Exam General appearance: alert, well developed, well nourished, cooperative and in no distress Head: Normocephalic, without obvious abnormality, atraumatic Respiratory: Respirations even and unlabored, normal respiratory rate Heart: rate and rhythm normal. No gallop or murmurs noted on exam  Abdomen: BS +,  no distention, no rebound tenderness, or no mass Extremities: No gross deformities Skin: Skin color, texture, turgor normal. No rashes seen  Psych: Appropriate mood and affect. Neurologic:Alert, oriented to person, place, and time, thought content appropriate. UC Treatments / Results  Labs (all labs ordered are listed, but only abnormal results are displayed) Labs Reviewed - No data to display  EKG None  Radiology No results found.  Procedures Procedures (including critical care time)  Medications Ordered in UC Medications - No data to display  Initial Impression / Assessment and Plan / UC Course  I have reviewed the triage vital signs and the nursing notes.  Pertinent labs & imaging results that were available during my care of the patient were reviewed by me and considered in my medical decision making (see chart for details).    Intractable hiccups x 2 days. Will trial PPI therapy BID x 5-10 days. Educated on attempting relief with  Valsalva maneuver. Patient was able to attempt these measures without improvement of hiccups. Patient advised to return for care if symptoms do not improve. Final Clinical Impressions(s) / UC Diagnoses   Final diagnoses:  Intractable hiccups   Discharge Instructions   None    ED Prescriptions    Medication Sig Dispense Auth. Provider   pantoprazole (PROTONIX) 40 MG tablet Take 1 tablet (40 mg total) by mouth 2 (two) times daily for 10 days. 20 tablet Bing Neighbors, FNP     Controlled Substance Prescriptions [place] Controlled Substance Registry consulted? Not Applicable   Bing Neighbors, FNP 12/27/18 1806
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
## 2020-10-22 ENCOUNTER — Other Ambulatory Visit: Payer: Self-pay

## 2020-10-22 ENCOUNTER — Encounter (HOSPITAL_COMMUNITY): Payer: Self-pay | Admitting: Emergency Medicine

## 2020-10-22 ENCOUNTER — Ambulatory Visit (HOSPITAL_COMMUNITY)
Admission: EM | Admit: 2020-10-22 | Discharge: 2020-10-22 | Disposition: A | Discharge status: Home / Self Care | Payer: Medicaid Other

## 2020-10-22 DIAGNOSIS — J3489 Other specified disorders of nose and nasal sinuses: Secondary | ICD-10-CM

## 2020-10-22 NOTE — ED Provider Notes (Signed)
[facility]    CSN: [id number] Arrival date & time: 10/22/20  1202      History   Chief Complaint Chief Complaint  Patient presents with  . Mass    HPI Patrick Warren is a 35 y.o. male.   HPI  Mass: Pt reports a mass of his right nostril for the past month. The lesion is not painful. The lesion bleeds on its on from time to time and when picked at. The lesion has gotten bigger with time. No history of peeling sunburn, trauma or piercing of this area. He has used neosporin on the area without improvement. No personal or family history of skin cancer.    Past Medical History:  Diagnosis Date  . Arthritis     There are no problems to display for this patient.   History reviewed. No pertinent surgical history.   Home Medications    Prior to Admission medications   Medication Sig Start Date End Date Taking? Authorizing Provider  pantoprazole (PROTONIX) 40 MG tablet Take 1 tablet (40 mg total) by mouth 2 (two) times daily for 10 days. 12/27/18 10/22/20  Bing Neighbors, FNP    Family History Family History  Problem Relation Age of Onset  . Cancer Father     Social History Social History   Tobacco Use  . Smoking status: Current Every Day Smoker    Types: Cigarettes  . Smokeless tobacco: Never Used  Substance Use Topics  . Alcohol use: Yes  . Drug use: Yes    Types: Marijuana     Allergies   Patient has no known allergies.   Review of Systems Review of Systems  As stated above in HPI Physical Exam Triage Vital Signs ED Triage Vitals  Enc Vitals Group     BP 10/22/20 1233 131/83     Pulse Rate 10/22/20 1233 82     Resp 10/22/20 1233 18     Temp 10/22/20 1233 (!) 97.1 F (36.2 C)     Temp Source 10/22/20 1233 Temporal     SpO2 10/22/20 1233 98 %     Weight --      Height --      Head Circumference --      Peak Flow --      Pain Score 10/22/20 1232 0     Pain Loc --      Pain Edu? --      Excl. in GC? --    No data  found.  Updated Vital Signs BP 131/83 (BP Location: Right Arm)   Pulse 82   Temp (!) 97.1 F (36.2 C) (Temporal)   Resp 18   SpO2 98%   Physical Exam Vitals and nursing note reviewed.  Skin:    Comments: There is a 25mm round pedunculated erythematic lesion of the right nostril with telangiectasias. No active bleeding. No other lesions of concern of face      UC Treatments / Results  Labs (all labs ordered are listed, but only abnormal results are displayed) Labs Reviewed - No data to display  EKG   Radiology No results found.  Procedures Procedures (including critical care time)  Medications Ordered in UC Medications - No data to display  Initial Impression / Assessment and Plan / UC Course  I have reviewed the triage vital signs and the nursing notes.  Pertinent labs & imaging results that were available during my care of the patient were reviewed by me and considered in my  medical decision making (see chart for details).     New. Discussed my concerns with patient in detail and discussed that this does not appear to be Melanoma which is great news. Likely the bump will be removed by [facility] surgery which I also discussed. Ensured that he understood that this is not a type of concern that should keep him up at night or would be fatal this is just likely a mass that needs to be biopsied and removed. I have given him a phone number to contact of one of the larger dermatology groups in the area as they may be able to see him sooner. I also printed off my concern on his AVS so he can tell them why he is being referred by me. For now avoid picking the area. He can apply Vaseline or Aquaphor instead of neosporin.   Final Clinical Impressions(s) / UC Diagnoses   Final diagnoses:  Lesion of nose     Discharge Instructions     I would like for you to call a Dermatology office of your choice to be seen for an evaluation of a possible Basil Cell or Squamous Cell Sarcoma of  your right nostril.     ED Prescriptions    None     PDMP not reviewed this encounter.   Rushie Chestnut, [place] 10/22/20 1342

## 2020-10-22 NOTE — Discharge Instructions (Addendum)
I would like for you to call a Dermatology office of your choice to be seen for an evaluation of a possible Basil Cell or Squamous Cell Sarcoma of your right nostril.

## 2020-10-22 NOTE — ED Triage Notes (Signed)
Pt states that he noticed a bump popped up on his nose that appeared a month ago. Pt states that he feels no pain but it does bleed when he wipes or blow his nose. Pt states that he has tried to removed it but it keeps reappearing

## 2023-02-06 ENCOUNTER — Encounter (HOSPITAL_COMMUNITY): Payer: Self-pay

## 2023-02-06 ENCOUNTER — Emergency Department (HOSPITAL_COMMUNITY): Payer: No Typology Code available for payment source

## 2023-02-06 ENCOUNTER — Emergency Department (HOSPITAL_COMMUNITY)
Admission: EM | Admit: 2023-02-06 | Discharge: 2023-02-06 | Disposition: A | Discharge status: Home / Self Care | Payer: No Typology Code available for payment source | Attending: Emergency Medicine | Admitting: Emergency Medicine

## 2023-02-06 ENCOUNTER — Other Ambulatory Visit: Payer: Self-pay

## 2023-02-06 DIAGNOSIS — Y92410 Unspecified street and highway as the place of occurrence of the external cause: Secondary | ICD-10-CM | POA: Diagnosis not present

## 2023-02-06 DIAGNOSIS — S43402A Unspecified sprain of left shoulder joint, initial encounter: Secondary | ICD-10-CM | POA: Diagnosis not present

## 2023-02-06 DIAGNOSIS — M25512 Pain in left shoulder: Secondary | ICD-10-CM | POA: Diagnosis present

## 2023-02-06 DIAGNOSIS — F17210 Nicotine dependence, cigarettes, uncomplicated: Secondary | ICD-10-CM | POA: Diagnosis not present

## 2023-02-06 MED ORDER — KETOROLAC TROMETHAMINE 15 MG/ML IJ SOLN
15.0000 mg | Freq: Once | INTRAMUSCULAR | Status: AC
  Administered 2023-02-06: 15 mg via INTRAMUSCULAR

## 2023-02-06 MED ORDER — KETOROLAC TROMETHAMINE 15 MG/ML IJ SOLN
15.0000 mg | Freq: Once | INTRAMUSCULAR | Status: DC
  Filled 2023-02-06: qty 1

## 2023-02-06 NOTE — ED Notes (Signed)
DC instructions reviewed with pt. PT verbalized understanding. Pt DC °
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
## 2023-02-06 NOTE — Progress Notes (Signed)
Orthopedic Tech Progress Note Patient Details:  Patrick Warren [birth year] [id number]  Ortho Devices Type of Ortho Device: Sling immobilizer Ortho Device/Splint Location: LUE Ortho Device/Splint Interventions: Ordered, Application, Adjustment   Post Interventions Patient Tolerated: Well Instructions Provided: Care of device  Donald Pore 02/06/2023, 1:48 PM
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
## 2023-11-11 ENCOUNTER — Ambulatory Visit (HOSPITAL_COMMUNITY)
Admission: EM | Admit: 2023-11-11 | Discharge: 2023-11-11 | Disposition: A | Discharge status: Home / Self Care | Attending: Emergency Medicine | Admitting: Emergency Medicine

## 2023-11-11 ENCOUNTER — Encounter (HOSPITAL_COMMUNITY): Payer: Self-pay

## 2023-11-11 DIAGNOSIS — Z202 Contact with and (suspected) exposure to infections with a predominantly sexual mode of transmission: Secondary | ICD-10-CM | POA: Diagnosis present

## 2023-11-11 LAB — HIV ANTIBODY (ROUTINE TESTING W REFLEX): HIV Screen 4th Generation wRfx: NONREACTIVE

## 2023-11-11 MED ORDER — METRONIDAZOLE 500 MG PO TABS
2000.0000 mg | ORAL_TABLET | Freq: Once | ORAL | Status: AC
  Administered 2023-11-11: 2000 mg via ORAL

## 2023-11-11 MED ORDER — METRONIDAZOLE 500 MG PO TABS
ORAL_TABLET | ORAL | Status: AC
  Filled 2023-11-11: qty 4

## 2023-11-11 NOTE — ED Triage Notes (Signed)
 Patient was told by a girl that he has been sleeping with that she tested positive for Trich.

## 2023-11-11 NOTE — Discharge Instructions (Addendum)
 Today you have been screened for sexually transmitted infections.  We have went ahead and treated you empirically for trichomoniasis with Flagyl.  Abstain from intercourse for the next 7 days.  Our staff will contact you if any additional treatment is needed based on your test results.  Return to clinic for new or urgent symptoms.

## 2023-11-11 NOTE — ED Provider Notes (Signed)
 [facility]    CSN: [id number] Arrival date & time: 11/11/23  1728      History   Chief Complaint Chief Complaint  Patient presents with   SEXUALLY TRANSMITTED DISEASE    HPI Patrick Warren is a 38 y.o. male.   Patient presents to clinic over concerns of exposure to trichomoniasis.  A girl he has been sexually active with unprotected got notified that she tested positive for trichomoniasis on Friday.  He was last sexually active with her about a week or so ago.  Did not use a condom or any barrier method.  He has not had any symptoms, denies discharge or dysuria.  Would like HIV and syphilis screening.  The history is provided by the patient and medical records.    Past Medical History:  Diagnosis Date   Arthritis     There are no active problems to display for this patient.   History reviewed. No pertinent surgical history.     Home Medications    Prior to Admission medications   Medication Sig Start Date End Date Taking? Authorizing Provider  pantoprazole (PROTONIX) 40 MG tablet Take 1 tablet (40 mg total) by mouth 2 (two) times daily for 10 days. 12/27/18 10/22/20  Bing Neighbors, NP    Family History Family History  Problem Relation Age of Onset   Cancer Father     Social History Social History   Tobacco Use   Smoking status: Former    Types: Cigarettes   Smokeless tobacco: Never  Vaping Use   Vaping status: Never Used  Substance Use Topics   Alcohol use: Yes   Drug use: Yes    Types: Marijuana     Allergies   Patient has no known allergies.   Review of Systems Review of Systems  Per HPI   Physical Exam Triage Vital Signs ED Triage Vitals  Encounter Vitals Group     BP 11/11/23 1828 123/80     Systolic BP Percentile --      Diastolic BP Percentile --      Pulse Rate 11/11/23 1828 79     Resp 11/11/23 1828 16     Temp 11/11/23 1828 98.2 F (36.8 C)     Temp Source 11/11/23 1828 Oral     SpO2 11/11/23 1828 97  %     Weight 11/11/23 1828 160 lb (72.6 kg)     Height 11/11/23 1828 5\' 4"  (1.626 m)     Head Circumference --      Peak Flow --      Pain Score 11/11/23 1829 0     Pain Loc --      Pain Education --      Exclude from Growth Chart --    No data found.  Updated Vital Signs BP 123/80 (BP Location: Right Arm)   Pulse 79   Temp 98.2 F (36.8 C) (Oral)   Resp 16   Ht 5\' 4"  (1.626 m)   Wt 160 lb (72.6 kg)   SpO2 97%   BMI 27.46 kg/m   Visual Acuity Right Eye Distance:   Left Eye Distance:   Bilateral Distance:    Right Eye Near:   Left Eye Near:    Bilateral Near:     Physical Exam Vitals and nursing note reviewed.  Constitutional:      Appearance: Normal appearance.  HENT:     Head: Normocephalic and atraumatic.     Right Ear: External ear normal.  Left Ear: External ear normal.     Nose: Nose normal.     Mouth/Throat:     Mouth: Mucous membranes are moist.  Eyes:     Conjunctiva/sclera: Conjunctivae normal.  Cardiovascular:     Rate and Rhythm: Normal rate.  Pulmonary:     Effort: Pulmonary effort is normal. No respiratory distress.  Neurological:     General: No focal deficit present.     Mental Status: He is alert.  Psychiatric:        Mood and Affect: Mood normal.      UC Treatments / Results  Labs (all labs ordered are listed, but only abnormal results are displayed) Labs Reviewed  HIV ANTIBODY (ROUTINE TESTING W REFLEX)  RPR  CYTOLOGY, (ORAL, ANAL, URETHRAL) ANCILLARY ONLY    EKG   Radiology No results found.  Procedures Procedures (including critical care time)  Medications Ordered in UC Medications  metroNIDAZOLE (FLAGYL) tablet 2,000 mg (has no administration in time range)    Initial Impression / Assessment and Plan / UC Course  I have reviewed the triage vital signs and the nursing notes.  Pertinent labs & imaging results that were available during my care of the patient were reviewed by me and considered in my medical  decision making (see chart for details).  Vitals and triage reviewed, patient is hemodynamically stable.  Exposure to trichomoniasis, asymptomatic, will treat empirically with Flagyl.  Cytology swab as well as HIV and syphilis screening obtained, staff will contact if additional treatment or follow-up is needed.  Plan of care, follow-up care return precautions given, no questions at this time.     Final Clinical Impressions(s) / UC Diagnoses   Final diagnoses:  Exposure to sexually transmitted infection     Discharge Instructions      Today you have been screened for sexually transmitted infections.  We have went ahead and treated you empirically for trichomoniasis with Flagyl.  Abstain from intercourse for the next 7 days.  Our staff will contact you if any additional treatment is needed based on your test results.  Return to clinic for new or urgent symptoms.    ED Prescriptions   None    PDMP not reviewed this encounter.   Diontre Harps, [place], [place] 11/11/23 [phone]

## 2023-11-12 LAB — CYTOLOGY, (ORAL, ANAL, URETHRAL) ANCILLARY ONLY
Chlamydia: NEGATIVE
Comment: NEGATIVE
Comment: NEGATIVE
Comment: NORMAL
Neisseria Gonorrhea: NEGATIVE
Trichomonas: POSITIVE — AB

## 2023-11-12 LAB — RPR: RPR Ser Ql: NONREACTIVE
